# Patient Record
Sex: Female | Born: 1975 | Race: Black or African American | Hispanic: No | Marital: Married | Smoking: Never smoker
Health system: Southern US, Community
[De-identification: ages and names within clinical notes are randomized; demographics above are authoritative.]

---

## 2015-07-09 ENCOUNTER — Ambulatory Visit
Admission: RE | Admit: 2015-07-09 | Discharge: 2015-07-09 | Disposition: A | Discharge status: Home / Self Care | Payer: No Typology Code available for payment source | Source: Ambulatory Visit | Attending: Infectious Disease | Admitting: Infectious Disease

## 2015-07-09 ENCOUNTER — Other Ambulatory Visit: Payer: Self-pay | Admitting: Infectious Disease

## 2015-07-09 DIAGNOSIS — R7612 Nonspecific reaction to cell mediated immunity measurement of gamma interferon antigen response without active tuberculosis: Secondary | ICD-10-CM

## 2015-10-08 ENCOUNTER — Emergency Department (HOSPITAL_COMMUNITY): Payer: Medicaid Other

## 2015-10-08 ENCOUNTER — Emergency Department (EMERGENCY_DEPARTMENT_HOSPITAL)
Admit: 2015-10-08 | Discharge: 2015-10-08 | Disposition: A | Discharge status: Home / Self Care | Payer: Medicaid Other | Attending: Emergency Medicine | Admitting: Emergency Medicine

## 2015-10-08 ENCOUNTER — Emergency Department (HOSPITAL_COMMUNITY)
Admission: EM | Admit: 2015-10-08 | Discharge: 2015-10-08 | Disposition: A | Discharge status: Home / Self Care | Payer: Medicaid Other | Attending: Emergency Medicine | Admitting: Emergency Medicine

## 2015-10-08 ENCOUNTER — Encounter (HOSPITAL_COMMUNITY): Payer: Self-pay | Admitting: *Deleted

## 2015-10-08 DIAGNOSIS — M79604 Pain in right leg: Secondary | ICD-10-CM

## 2015-10-08 DIAGNOSIS — R911 Solitary pulmonary nodule: Secondary | ICD-10-CM | POA: Insufficient documentation

## 2015-10-08 DIAGNOSIS — B349 Viral infection, unspecified: Secondary | ICD-10-CM | POA: Diagnosis not present

## 2015-10-08 DIAGNOSIS — M79605 Pain in left leg: Secondary | ICD-10-CM | POA: Diagnosis not present

## 2015-10-08 DIAGNOSIS — Z3202 Encounter for pregnancy test, result negative: Secondary | ICD-10-CM | POA: Insufficient documentation

## 2015-10-08 DIAGNOSIS — R52 Pain, unspecified: Secondary | ICD-10-CM | POA: Diagnosis present

## 2015-10-08 LAB — CBC
HEMATOCRIT: 34.2 % — AB (ref 36.0–46.0)
Hemoglobin: 12 g/dL (ref 12.0–15.0)
MCH: 31.7 pg (ref 26.0–34.0)
MCHC: 35.1 g/dL (ref 30.0–36.0)
MCV: 90.5 fL (ref 78.0–100.0)
PLATELETS: 151 10*3/uL (ref 150–400)
RBC: 3.78 MIL/uL — ABNORMAL LOW (ref 3.87–5.11)
RDW: 12.8 % (ref 11.5–15.5)
WBC: 3.8 10*3/uL — ABNORMAL LOW (ref 4.0–10.5)

## 2015-10-08 LAB — URINALYSIS, ROUTINE W REFLEX MICROSCOPIC
BILIRUBIN URINE: NEGATIVE
Glucose, UA: NEGATIVE mg/dL
KETONES UR: NEGATIVE mg/dL
NITRITE: NEGATIVE
PROTEIN: NEGATIVE mg/dL
SPECIFIC GRAVITY, URINE: 1.008 (ref 1.005–1.030)
pH: 7 (ref 5.0–8.0)

## 2015-10-08 LAB — I-STAT TROPONIN, ED: TROPONIN I, POC: 0 ng/mL (ref 0.00–0.08)

## 2015-10-08 LAB — BASIC METABOLIC PANEL
ANION GAP: 10 (ref 5–15)
BUN: 5 mg/dL — ABNORMAL LOW (ref 6–20)
CHLORIDE: 105 mmol/L (ref 101–111)
CO2: 20 mmol/L — ABNORMAL LOW (ref 22–32)
Calcium: 8.3 mg/dL — ABNORMAL LOW (ref 8.9–10.3)
Creatinine, Ser: 0.52 mg/dL (ref 0.44–1.00)
GFR calc Af Amer: >60 mL/min (ref >60)
GLUCOSE: 117 mg/dL — AB (ref 65–99)
POTASSIUM: 3.3 mmol/L — AB (ref 3.5–5.1)
Sodium: 135 mmol/L (ref 135–145)

## 2015-10-08 LAB — D-DIMER, QUANTITATIVE (NOT AT ARMC): D DIMER QUANT: 0.63 ug{FEU}/mL — AB (ref 0.00–0.50)

## 2015-10-08 LAB — URINE MICROSCOPIC-ADD ON

## 2015-10-08 LAB — PREGNANCY, URINE: PREG TEST UR: NEGATIVE

## 2015-10-08 MED ORDER — IOHEXOL 350 MG/ML SOLN
100.0000 mL | Freq: Once | INTRAVENOUS | Status: AC | PRN
  Administered 2015-10-08: 100 mL via INTRAVENOUS

## 2015-10-08 NOTE — ED Notes (Signed)
Pt ambulated to BR without difficulty. U/A obtained.

## 2015-10-08 NOTE — Discharge Instructions (Signed)
You need to have a repeat CT scan of your chest in one year to reassess the pulmonary nodule found today.   Pulmonary Nodule A pulmonary nodule is a small, round growth of tissue in the lung. Pulmonary nodules can range in size from less than 1/5 inch (4 mm) to a little bigger than an inch (25 mm). Most pulmonary nodules are detected when imaging tests of the lung are being performed for a different problem. Pulmonary nodules are usually not cancerous (benign). However, some pulmonary nodules are cancerous (malignant). Follow-up treatment or testing is based on the size of the pulmonary nodule and your risk of getting lung cancer.  CAUSES Benign pulmonary nodules can be caused by various things. Some of the causes include:   Bacterial, fungal, or viral infections. This is usually an old infection that is no longer active, but it can sometimes be a current, active infection.  A benign mass of tissue.  Inflammation from conditions such as rheumatoid arthritis.   Abnormal blood vessels in the lungs. Malignant pulmonary nodules can result from lung cancer or from cancers that spread to the lung from other places in the body. SIGNS AND SYMPTOMS Pulmonary nodules usually do not cause symptoms. DIAGNOSIS Most often, pulmonary nodules are found incidentally when an X-ray or CT scan is performed to look for some other problem in the lung area. To help determine whether a pulmonary nodule is benign or malignant, your health care provider will take a medical history and order a variety of tests. Tests done may include:   Blood tests.  A skin test called a tuberculin test. This test is used to determine if you have been exposed to the germ that causes tuberculosis.   Chest X-rays. If possible, a new X-ray may be compared with X-rays you have had in the past.   CT scan. This test shows smaller pulmonary nodules more clearly than an X-ray.   Positron emission tomography (PET) scan. In this test,  a safe amount of a radioactive substance is injected into the bloodstream. Then, the scan takes a picture of the pulmonary nodule. The radioactive substance is eliminated from your body in your urine.   Biopsy. A tiny piece of the pulmonary nodule is removed so it can be checked under a microscope. TREATMENT  Pulmonary nodules that are benign normally do not require any treatment because they usually do not cause symptoms or breathing problems. Your health care provider may want to monitor the pulmonary nodule through follow-up CT scans. The frequency of these CT scans will vary based on the size of the nodule and the risk factors for lung cancer. For example, CT scans will need to be done more frequently if the pulmonary nodule is larger and if you have a history of smoking and a family history of cancer. Further testing or biopsies may be done if any follow-up CT scan shows that the size of the pulmonary nodule has increased. HOME CARE INSTRUCTIONS  Only take over-the-counter or prescription medicines as directed by your health care provider.  Keep all follow-up appointments with your health care provider. SEEK MEDICAL CARE IF:  You have trouble breathing when you are active.   You feel sick or unusually tired.   You do not feel like eating.   You lose weight without trying to.   You develop chills or night sweats.  SEEK IMMEDIATE MEDICAL CARE IF:  You cannot catch your breath, or you begin wheezing.   You cannot stop coughing.  You cough up blood.   You become dizzy or feel like you are going to pass out.   You have sudden chest pain.   You have a fever or persistent symptoms for more than 2-3 days.   You have a fever and your symptoms suddenly get worse. MAKE SURE YOU:  Understand these instructions.  Will watch your condition.  Will get help right away if you are not doing well or get worse.   This information is not intended to replace advice given to  you by your health care provider. Make sure you discuss any questions you have with your health care provider.   Document Released: 06/08/2009 Document Revised: 04/13/2013 Document Reviewed: 01/31/2013 Elsevier Interactive Patient Education 2016 Elsevier Inc. Viral Infections A virus is a type of germ. Viruses can cause:  Minor sore throats.  Aches and pains.  Headaches.  Runny nose.  Rashes.  Watery eyes.  Tiredness.  Coughs.  Loss of appetite.  Feeling sick to your stomach (nausea).  Throwing up (vomiting).  Watery poop (diarrhea). HOME CARE   Only take medicines as told by your doctor.  Drink enough water and fluids to keep your pee (urine) clear or pale yellow. Sports drinks are a good choice.  Get plenty of rest and eat healthy. Soups and broths with crackers or rice are fine. GET HELP RIGHT AWAY IF:   You have a very bad headache.  You have shortness of breath.  You have chest pain or neck pain.  You have an unusual rash.  You cannot stop throwing up.  You have watery poop that does not stop.  You cannot keep fluids down.  You or your child has a temperature by mouth above 102 F (38.9 C), not controlled by medicine.  Your baby is older than 3 months with a rectal temperature of 102 F (38.9 C) or higher.  Your baby is 27 months old or younger with a rectal temperature of 100.4 F (38 C) or higher. MAKE SURE YOU:   Understand these instructions.  Will watch this condition.  Will get help right away if you are not doing well or get worse.   This information is not intended to replace advice given to you by your health care provider. Make sure you discuss any questions you have with your health care provider.   Document Released: 07/24/2008 Document Revised: 11/03/2011 Document Reviewed: 01/17/2015 Elsevier Interactive Patient Education [employer]

## 2015-10-08 NOTE — Progress Notes (Signed)
VASCULAR LAB PRELIMINARY  PRELIMINARY  PRELIMINARY  PRELIMINARY  Bilateral lower extremity venous duplex completed.    Preliminary report:  There is no DVT or SVT noted in the bilateral lower extremities.   Ryett Hamman, RVT 10/08/2015, 11:54 AM

## 2015-10-08 NOTE — ED Provider Notes (Signed)
CSN: [id number]     Arrival date & time 10/08/15  1324 History   First MD Initiated Contact with Patient 10/08/15 [phone]     Chief Complaint  Patient presents with  . Generalized Body Aches     (Consider location/radiation/quality/duration/timing/severity/associated sxs/prior Treatment) HPI Comments: Patient presents to the emergency department with chief complaint of generalized body aches. She reports associated chest pain, cough, headache, and lower leg swelling. She reports that she has had a fever, but did not measure it. She denies any recent travel or surgeries. There are no aggravating or alleviating factors. She has tried taking ibuprofen with no relief.  The history is provided by the patient. The history is limited by a language barrier. A language interpreter was used.    History reviewed. No pertinent past medical history. History reviewed. No pertinent past surgical history. No family history on file. Social History  Substance Use Topics  . Smoking status: Never Smoker   . Smokeless tobacco: None  . Alcohol Use: None   OB History    No data available     Review of Systems  Constitutional: Positive for fever. Negative for chills.  Respiratory: Positive for cough. Negative for shortness of breath.   Cardiovascular: Negative for chest pain.  Gastrointestinal: Negative for nausea, vomiting, diarrhea and constipation.  Genitourinary: Negative for dysuria.  Musculoskeletal: Positive for myalgias.  All other systems reviewed and are negative.     Allergies  Review of patient's allergies indicates no known allergies.  Home Medications   Prior to Admission medications   Not on File   BP 92/74 mmHg  Pulse 85  Temp(Src) 99 F (37.2 C) (Oral)  Resp 16  SpO2 97%  LMP 10/03/2015 (Exact Date) Physical Exam  Constitutional: She is oriented to person, place, and time. She appears well-developed and well-nourished.  HENT:  Head: Normocephalic and atraumatic.   Eyes: Conjunctivae and EOM are normal. Pupils are equal, round, and reactive to light.  Neck: Normal range of motion. Neck supple.  Cardiovascular: Normal rate and regular rhythm.  Exam reveals no gallop and no friction rub.   No murmur heard. Pulmonary/Chest: Effort normal and breath sounds normal. No respiratory distress. She has no wheezes. She has no rales. She exhibits no tenderness.  CTAB  Abdominal: Soft. Bowel sounds are normal. She exhibits no distension and no mass. There is no tenderness. There is no rebound and no guarding.  No focal abdominal tenderness, no RLQ tenderness or pain at McBurney's point, no RUQ tenderness or Murphy's sign, no left-sided abdominal tenderness, no fluid wave, or signs of peritonitis   Musculoskeletal: Normal range of motion. She exhibits no edema or tenderness.  No appreciable leg swelling or calf tenderness on exam  Neurological: She is alert and oriented to person, place, and time.  Skin: Skin is warm and dry.  Psychiatric: She has a normal mood and affect. Her behavior is normal. Judgment and thought content normal.  Nursing note and vitals reviewed.   ED Course  Procedures (including critical care time) Results for orders placed or performed during the hospital encounter of 10/08/15  CBC  Result Value Ref Range   WBC 3.8 (L) 4.0 - 10.5 K/uL   RBC 3.78 (L) 3.87 - 5.11 MIL/uL   Hemoglobin 12.0 12.0 - 15.0 g/dL   HCT 27.2 (L) 53.6 - 64.4 %   MCV 90.5 78.0 - 100.0 fL   MCH 31.7 26.0 - 34.0 pg   MCHC 35.1 30.0 - 36.0 g/dL  RDW 12.8 11.5 - 15.5 %   Platelets 151 150 - 400 K/uL  Basic metabolic panel  Result Value Ref Range   Sodium 135 135 - 145 mmol/L   Potassium 3.3 (L) 3.5 - 5.1 mmol/L   Chloride 105 101 - 111 mmol/L   CO2 20 (L) 22 - 32 mmol/L   Glucose, Bld 117 (H) 65 - 99 mg/dL   BUN 5 (L) 6 - 20 mg/dL   Creatinine, Ser 1.61 0.44 - 1.00 mg/dL   Calcium 8.3 (L) 8.9 - 10.3 mg/dL   GFR calc non Af Amer >60 >60 mL/min   GFR calc Af  Amer >60 >60 mL/min   Anion gap 10 5 - 15  D-dimer, quantitative (not at [facility])  Result Value Ref Range   D-Dimer, Quant 0.63 (H) 0.00 - 0.50 ug/mL-FEU  Urinalysis, Routine w reflex microscopic (not at [facility])  Result Value Ref Range   Color, Urine AMBER (A) YELLOW   APPearance CLEAR CLEAR   Specific Gravity, Urine 1.008 1.005 - 1.030   pH 7.0 5.0 - 8.0   Glucose, UA NEGATIVE NEGATIVE mg/dL   Hgb urine dipstick MODERATE (A) NEGATIVE   Bilirubin Urine NEGATIVE NEGATIVE   Ketones, ur NEGATIVE NEGATIVE mg/dL   Protein, ur NEGATIVE NEGATIVE mg/dL   Nitrite NEGATIVE NEGATIVE   Leukocytes, UA TRACE (A) NEGATIVE  Pregnancy, urine  Result Value Ref Range   Preg Test, Ur NEGATIVE NEGATIVE  Urine microscopic-add on  Result Value Ref Range   Squamous Epithelial / LPF 6-30 (A) NONE SEEN   WBC, UA 0-5 0 - 5 WBC/hpf   RBC / HPF 0-5 0 - 5 RBC/hpf   Bacteria, UA FEW (A) NONE SEEN  I-stat troponin, ED  Result Value Ref Range   Troponin i, poc 0.00 0.00 - 0.08 ng/mL   Comment 3           Dg Chest 2 View  10/08/2015  CLINICAL DATA:  Generalized body aches, leg pain and cough today. Initial encounter. EXAM: CHEST  2 VIEW COMPARISON:  Seen abscess 07/09/2015. FINDINGS: The lungs are clear. Heart size is upper normal. No pneumothorax or pleural effusion. No focal bony abnormality. IMPRESSION: No acute disease. Electronically Signed   By: Drusilla Kanner M.D.   On: 10/08/2015 10:44   Ct Angio Chest Pe W/cm &/or Wo Cm  10/08/2015  CLINICAL DATA:  Chest pain. Bilateral lower extremity pain and swelling. EXAM: CT ANGIOGRAPHY CHEST WITH CONTRAST TECHNIQUE: Multidetector CT imaging of the chest was performed using the standard protocol during bolus administration of intravenous contrast. Multiplanar CT image reconstructions and MIPs were obtained to evaluate the vascular anatomy. CONTRAST:  OMNIPAQUE IOHEXOL 350 MG/ML SOLN COMPARISON:  10/08/2015 chest radiograph.  No prior chest CT. FINDINGS:  Mediastinum/Nodes: The study is moderate to high quality for the evaluation of pulmonary embolism, mildly limited by motion artifact in the subsegmental branches particularly in the lower lungs. There are no filling defects in the central, lobar, segmental or subsegmental pulmonary artery branches to suggest acute pulmonary embolism. Great vessels are normal in course and caliber. Normal heart size. No pericardial fluid/thickening. Normal visualized thyroid. Normal esophagus. No pathologically enlarged axillary, mediastinal or hilar lymph nodes. Lungs/Pleura: No pneumothorax. No pleural effusion. Subpleural 4 mm left lower lobe pulmonary nodule (series 5/ image 77). Irregular curvilinear parenchymal bands in both lower lobes are most suggestive of atelectasis. No acute consolidative airspace disease, additional significant pulmonary nodules or lung masses. There is diffuse bronchial wall thickening.  There is a mosaic attenuation throughout both lungs. Upper abdomen: Unremarkable. Musculoskeletal:  No aggressive appearing focal osseous lesions. Review of the MIP images confirms the above findings. IMPRESSION: 1. No evidence of acute pulmonary embolism. 2. Diffuse bronchial wall thickening and mosaic attenuation throughout both lungs, suggesting small airways disease with air trapping. Curvilinear parenchymal bands in both lower lobes are most suggestive of subsegmental atelectasis. 3. Left lower lobe 4 mm subpleural pulmonary nodule, probably benign. If the patient is at high risk for bronchogenic carcinoma, follow-up chest CT at 1 year is recommended. If the patient is at low risk, no follow-up is needed. This recommendation follows the consensus statement: Guidelines for Management of Small Pulmonary Nodules Detected on CT Scans: A Statement from the Fleischner Society as published in Radiology 2005; 237:395-400. Electronically Signed   By: Delbert Phenix M.D.   On: 10/08/2015 13:18    I have personally reviewed  and evaluated these images and lab results as part of my medical decision-making.   EKG Interpretation None      MDM   Final diagnoses:  Viral syndrome  Pulmonary nodule    Patient with vague symptoms of chest pain, leg pain, generally feeling poorly. Denies any fevers or chills. Denies nausea or vomiting. History difficult to obtain even using a professional interpreter. She has an elevated d-dimer, will check DVT studies and CT chest given her vague chest pain and leg pain complaints.  Labs are reassuring. Urinalysis is negative for infection. She does not have a leukocytosis. CT scan is negative for PE. There is a small pulmonary nodule, for which the patient will need to follow-up in one year. I discussed this with patient using the translator. Patient understands and agrees with the plan. No further workup indicated today. Will discharge to home.    Roxy Horseman, PA-C 10/08/15 1407  Blane Ohara, MD 10/08/15 [phone]

## 2015-10-08 NOTE — ED Notes (Signed)
Pt speaks Swahili interperter phone used. Pt reports generalized body aches and leg pain. Pt states that she has back pain as well but denies urinary symptoms. Pt states that she took ibuprofen this morning.

## 2015-10-08 NOTE — ED Notes (Signed)
Chest pain, headache, coughing, legs swollen -- per interpreter on language line.  Started yesterday, subjective fever,  Travel > 4 hours in 2 months-- denies Denies any vomiting or diarrhea States has pain and swelling to bilateral lower legs

## 2015-10-16 ENCOUNTER — Encounter: Payer: Self-pay | Admitting: Family Medicine

## 2015-10-16 ENCOUNTER — Ambulatory Visit: Payer: Medicaid Other | Attending: Family Medicine | Admitting: Family Medicine

## 2015-10-16 VITALS — BP 116/79 | HR 60 | Temp 98.5°F | Resp 15 | Ht 62.0 in | Wt 185.0 lb

## 2015-10-16 DIAGNOSIS — R911 Solitary pulmonary nodule: Secondary | ICD-10-CM | POA: Insufficient documentation

## 2015-10-16 DIAGNOSIS — E876 Hypokalemia: Secondary | ICD-10-CM | POA: Insufficient documentation

## 2015-10-16 DIAGNOSIS — Z131 Encounter for screening for diabetes mellitus: Secondary | ICD-10-CM | POA: Diagnosis not present

## 2015-10-16 LAB — POCT GLYCOSYLATED HEMOGLOBIN (HGB A1C): Hemoglobin A1C: 5

## 2015-10-16 NOTE — Progress Notes (Signed)
Patient's husband states patient has been sick with the flu for a week

## 2015-10-16 NOTE — Progress Notes (Signed)
CC: Follow-up from the ED  HPI: Whitney Phillips is a 40 y.o. female here today for a follow up visit from [facility] ED where she was seen for myalgias, feeling poorly, leg pain ; workup revealed normal CBC, hypokalemia of 3.3, an elevated d-dimer but lower extremity Doppler was negative for DVT and CT chest negative for pulmonary embolism but revealed a left lower lobe 4 mm pulmonary nodule with recommendations to follow-up in one year.   She is accompanied by her husband today and reports feeling well and is requesting a note to return back to work at the [employer] where she works as all her symptoms have resolved.  Patient has No headache, No chest pain, No abdominal pain - No Nausea, No new weakness tingling or numbness, No Cough - SOB.  No Known Allergies History reviewed. No pertinent past medical history. No current outpatient prescriptions on file prior to visit.   No current facility-administered medications on file prior to visit.   History reviewed. No pertinent family history. Social History   Social History  . Marital Status: Married    Spouse Name: N/A  . Number of Children: N/A  . Years of Education: N/A   Occupational History  . Not on file.   Social History Main Topics  . Smoking status: Never Smoker   . Smokeless tobacco: Not on file  . Alcohol Use: No  . Drug Use: No  . Sexual Activity: Not on file   Other Topics Concern  . Not on file   Social History Narrative    Review of Systems: Constitutional: Negative for fever, chills, diaphoresis, activity change, appetite change and fatigue. HENT: Negative for ear pain, nosebleeds, congestion, facial swelling, rhinorrhea, neck pain, neck stiffness and ear discharge.  Eyes: Negative for pain, discharge, redness, itching and visual disturbance. Respiratory: Negative for cough, choking, chest tightness, shortness of breath, wheezing and stridor.  Cardiovascular: Negative for chest pain, palpitations and  leg swelling. Gastrointestinal: Negative for abdominal distention. Genitourinary: Negative for dysuria, urgency, frequency, hematuria, flank pain, decreased urine volume, difficulty urinating and dyspareunia.  Musculoskeletal: Negative for back pain, joint swelling, arthralgias and gait problem. Neurological: Negative for dizziness, tremors, seizures, syncope, facial asymmetry, speech difficulty, weakness, light-headedness, numbness and headaches.  Hematological: Negative for adenopathy. Does not bruise/bleed easily. Psychiatric/Behavioral: Negative for hallucinations, behavioral problems, confusion, dysphoric mood, decreased concentration and agitation.    Objective:   Filed Vitals:   10/16/15 1152  BP: 116/79  Pulse: 60  Temp: 98.5 F (36.9 C)  Resp: 15    Physical Exam: Constitutional: Patient appears well-developed and well-nourished. No distress. HENT: Normocephalic, atraumatic, External right and left ear normal. Oropharynx is clear and moist.  Eyes: Conjunctivae and EOM are normal. PERRLA, no scleral icterus. Neck: Normal ROM. Neck supple. No JVD. No tracheal deviation. No thyromegaly. CVS: RRR, S1/S2 +, no murmurs, no gallops, no carotid bruit.  Pulmonary: Effort and breath sounds normal, no stridor, rhonchi, wheezes, rales.  Abdominal: Soft. BS +,  no distension, tenderness, rebound or guarding.  Musculoskeletal: Normal range of motion. No edema and no tenderness.  Lymphadenopathy: No lymphadenopathy noted, cervical, inguinal or axillary Neuro: Alert. Normal reflexes, muscle tone coordination. No cranial nerve deficit. Skin: Skin is warm and dry. No rash noted. Not diaphoretic. No erythema. No pallor. Psychiatric: Normal mood and affect. Behavior, judgment, thought content normal.  Lab Results  Component Value Date   WBC 3.8* 10/08/2015   HGB 12.0 10/08/2015   HCT 34.2* 10/08/2015  MCV 90.5 10/08/2015   PLT 151 10/08/2015   Lab Results  Component Value Date    CREATININE 0.52 10/08/2015   BUN 5* 10/08/2015   NA 135 10/08/2015   K 3.3* 10/08/2015   CL 105 10/08/2015   CO2 20* 10/08/2015    Lab Results  Component Value Date   HGBA1C 5.0 10/16/2015   Lipid Panel  No results found for: CHOL, TRIG, HDL, CHOLHDL, VLDL, LDLCALC     Assessment and plan:   Left lung pulmonary nodule: Follow-up CT scan in one year. I have written a note that the patient to return to work without restrictions.  Hypokalemia: Advised to increase intake of potassium rich foods. We'll hold off on replacement at this time  Jaclyn Shaggy, MD. [facility] [phone] 10/16/2015, 12:27 PM

## 2015-10-17 ENCOUNTER — Encounter: Payer: Self-pay | Admitting: *Deleted

## 2015-10-17 ENCOUNTER — Telehealth: Payer: Self-pay | Admitting: Family Medicine

## 2015-10-17 NOTE — Telephone Encounter (Signed)
Pt. Husband came in today to drop off a return to work certificate paperwork to be filled out by the pt. PCP.

## 2015-10-17 NOTE — Telephone Encounter (Signed)
Gave letter today allowing patient to return to work.

## 2015-10-17 NOTE — Telephone Encounter (Signed)
Pt.'s husband states that wife needs a letter from PCP specifying reason why patient is out of work....please follow up

## 2016-04-30 ENCOUNTER — Ambulatory Visit: Payer: Medicaid Other | Attending: Family Medicine | Admitting: Family Medicine

## 2016-04-30 ENCOUNTER — Encounter: Payer: Self-pay | Admitting: Family Medicine

## 2016-04-30 VITALS — BP 97/65 | HR 65 | Temp 98.0°F | Resp 17 | Wt 204.2 lb

## 2016-04-30 DIAGNOSIS — Z029 Encounter for administrative examinations, unspecified: Secondary | ICD-10-CM | POA: Insufficient documentation

## 2016-04-30 DIAGNOSIS — R0789 Other chest pain: Secondary | ICD-10-CM

## 2016-04-30 DIAGNOSIS — M79604 Pain in right leg: Secondary | ICD-10-CM

## 2016-04-30 DIAGNOSIS — R911 Solitary pulmonary nodule: Secondary | ICD-10-CM

## 2016-04-30 DIAGNOSIS — M79601 Pain in right arm: Secondary | ICD-10-CM

## 2016-04-30 MED ORDER — CYCLOBENZAPRINE HCL 10 MG PO TABS: 10.0000 mg | ORAL_TABLET | Freq: Every day | ORAL | 1 refills | Status: AC

## 2016-04-30 MED ORDER — NAPROXEN 500 MG PO TABS: 500.0000 mg | ORAL_TABLET | Freq: Two times a day (BID) | ORAL | 1 refills | Status: AC

## 2016-04-30 NOTE — Progress Notes (Signed)
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 Pain on my chest too.  Happening for a long time. The health center found out she had lump and swelling on chest and gave her medication.

## 2016-04-30 NOTE — Progress Notes (Signed)
Subjective:  Patient ID: Whitney Phillips, female    DOB: April 27, 1976  Age: 40 y.o. 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 She describes the pain as being "inside" and takes no analgesics for this. She also tells me she has a "problem with her lungs" which she would like to follow-up on. CT Angio chest in 09/2015 revealed an incidental finding of a solitary left lower lobe 4 mm subpleural nodule probably benign. She denies shortness of breath or wheezing and does not have a history of smoking.  No outpatient prescriptions prior to visit.   No facility-administered medications prior to visit.     ROS Review of Systems  Constitutional: Negative for activity change, appetite change and fatigue.  HENT: Negative for congestion, sinus pressure and sore throat.   Eyes: Negative for visual disturbance.  Respiratory: Negative for cough, chest tightness, shortness of breath and wheezing.   Cardiovascular: Positive for chest pain. Negative for palpitations.  Gastrointestinal: Negative for abdominal distention, abdominal pain and constipation.  Endocrine: Negative for polydipsia.  Genitourinary: Negative for dysuria and frequency.  Musculoskeletal:       See history of present illness  Skin: Negative for rash.  Neurological: Negative for tremors, light-headedness and numbness.  Hematological: Does not bruise/bleed easily.  Psychiatric/Behavioral: Negative for agitation and behavioral problems.    Objective:  BP 97/65 (BP Location: Right Arm)   Pulse 65   Temp 98 F (36.7 C)   Resp 17   Wt 204 lb 3.2 oz (92.6 kg)   SpO2 99%   BMI 37.35 kg/m   BP/Weight 04/30/2016 10/16/2015 10/08/2015  Systolic BP 97 116 113  Diastolic BP 65 79 92  Wt. (Lbs) 204.2 185 -  BMI 37.35 33.83 -      Physical Exam  Constitutional: She is oriented to person, place, and time. She appears well-developed  and well-nourished.  Cardiovascular: Normal rate, normal heart sounds and intact distal pulses.   No murmur heard. Pulmonary/Chest: Effort normal and breath sounds normal. She has no wheezes. She has no rales. She exhibits no tenderness.  Abdominal: Soft. Bowel sounds are normal. She exhibits no distension and no mass. There is no tenderness.  Musculoskeletal: Normal range of motion.  Neurological: She is alert and oriented to person, place, and time.     Assessment & Plan:   1. Solitary pulmonary nodule Follow-up CT chest due to in 09/2017  2. Musculoskeletal arm pain, right - naproxen (NAPROSYN) 500 MG tablet; Take 1 tablet (500 mg total) by mouth 2 (two) times daily with a meal.  Dispense: 60 tablet; Refill: 1 - cyclobenzaprine (FLEXERIL) 10 MG tablet; Take 1 tablet (10 mg total) by mouth at bedtime.  Dispense: 30 tablet; Refill: 1  3. Musculoskeletal chest pain EKG reveals normal sinus rhythm.   Meds ordered this encounter  Medications  . naproxen (NAPROSYN) 500 MG tablet    Sig: Take 1 tablet (500 mg total) by mouth 2 (two) times daily with a meal.    Dispense:  60 tablet    Refill:  1  . cyclobenzaprine (FLEXERIL) 10 MG tablet    Sig: Take 1 tablet (10 mg total) by mouth at bedtime.    Dispense:  30 tablet    Refill:  1    Follow-up: Return in about 5 months (around 09/30/2016), or if symptoms worsen or fail to improve, for follow up on  pulmonary nodule.   Jaclyn ShaggyEnobong Amao MD

## 2016-11-18 IMAGING — DX DG CHEST 2V
2 series · 2 of 2 positions shown · non-contrast
Comparison: Seen abscess 07/09/2015.

CLINICAL DATA: Generalized body aches, leg pain and cough today.
Initial encounter.

EXAM:
CHEST  2 VIEW

[w chest pa]
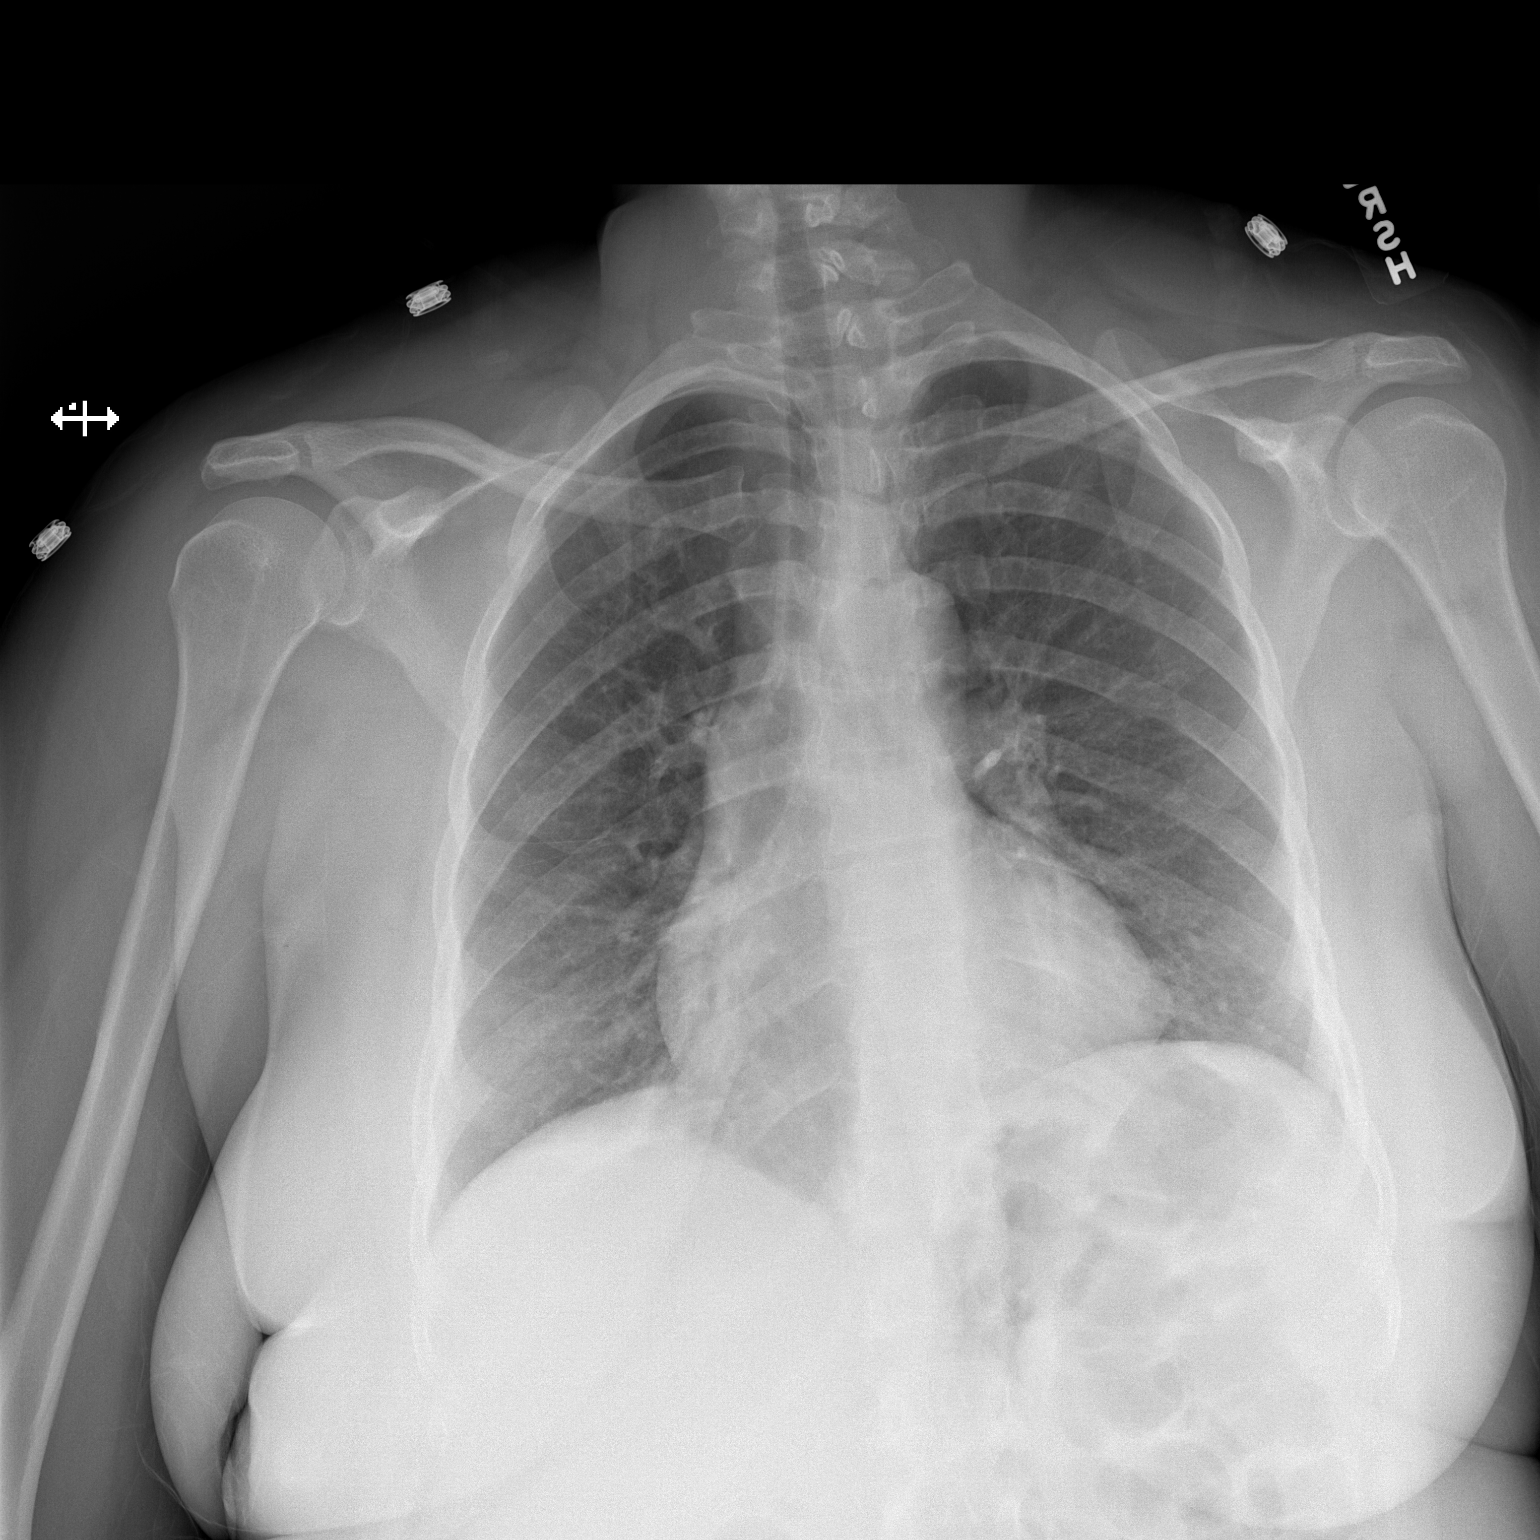

[w chest lat]
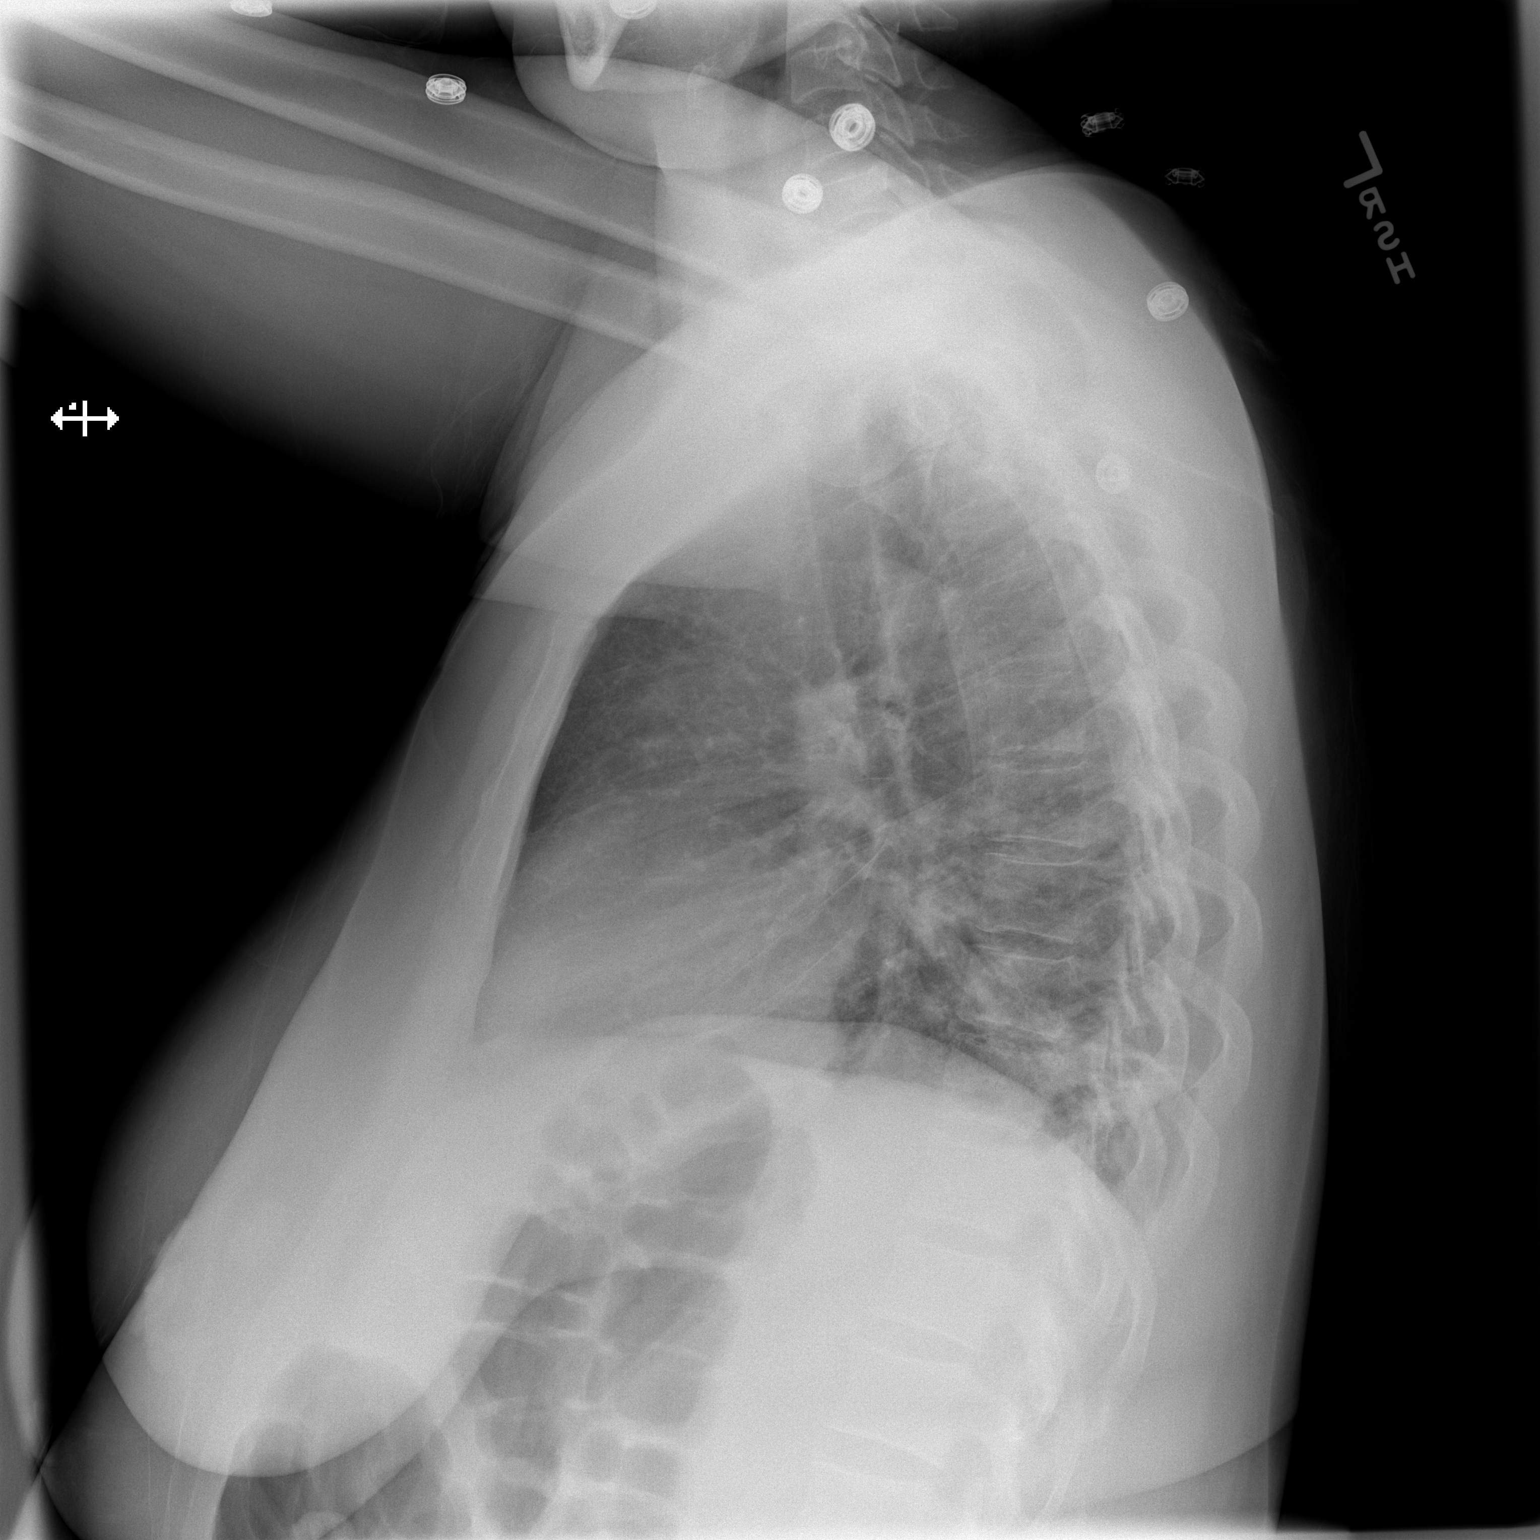

[2 of 2 positions shown; findings below may reference images not displayed]

FINDINGS: The lungs are clear. Heart size is upper normal. No pneumothorax or
pleural effusion. No focal bony abnormality.
IMPRESSION: No acute disease.

## 2016-11-18 IMAGING — CT CT ANGIO CHEST
3 of 6 series · 13 of 36 positions shown · IV contrast (Omni 300)
Comparison: 10/08/2015 chest radiograph.  No prior chest CT.

CLINICAL DATA: Chest pain. Bilateral lower extremity pain and
swelling.

EXAM:
CT ANGIOGRAPHY CHEST WITH CONTRAST
TECHNIQUE: Multidetector CT imaging of the chest was performed using the
standard protocol during bolus administration of intravenous
contrast. Multiplanar CT image reconstructions and MIPs were
obtained to evaluate the vascular anatomy.
CONTRAST:  100mL OMNIPAQUE IOHEXOL 350 MG/ML SOLN

[Series 4: pe 2mm · axial · 0.59mm/px · z∈[-258,-78]mm · 4 of 151 slices shown]
[im 31/151  lung]
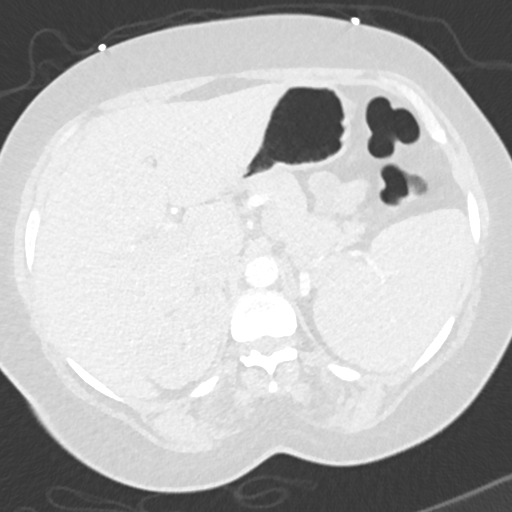
[im 61/151  mediastinal]
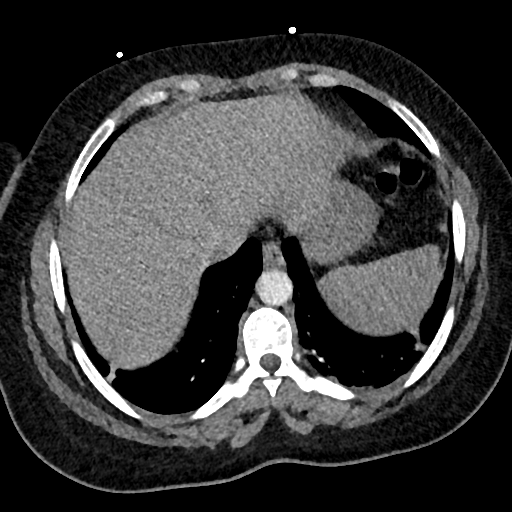
[im 91/151  lung]
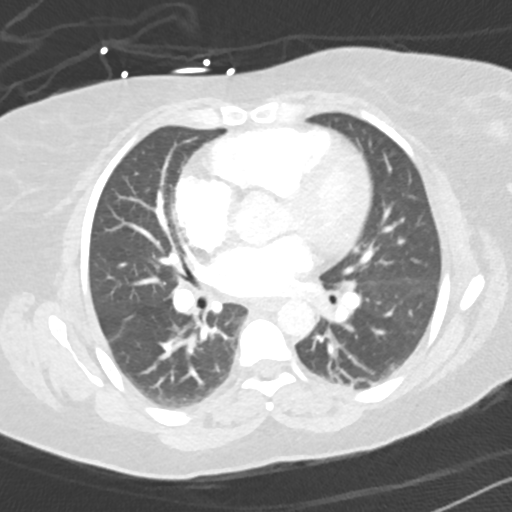
[im 121/151  mediastinal]
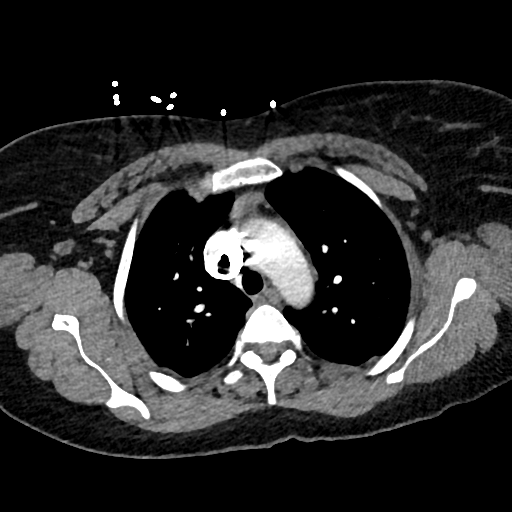

[Series 6: pe thins · axial · 0.59mm/px · z∈[-244,-42]mm · 8 of 502 slices shown]
[im 48/502  lung]
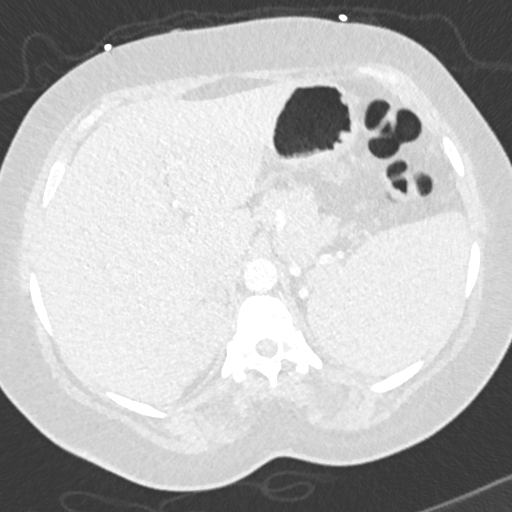
[im 96/502  lung]
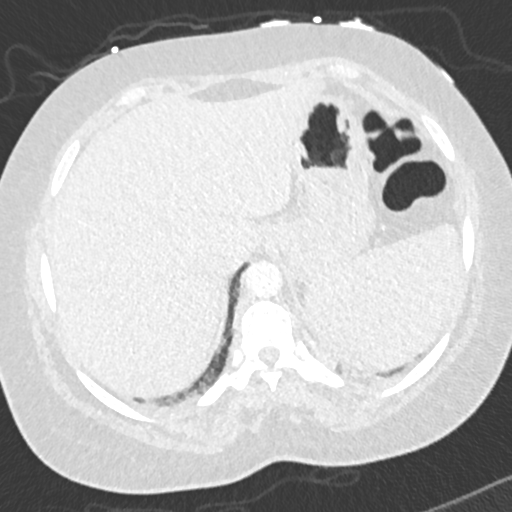
[im 168/502  lung]
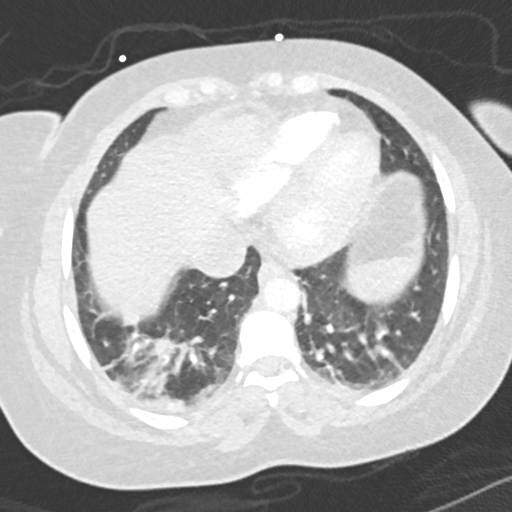
[im 215/502  lung]
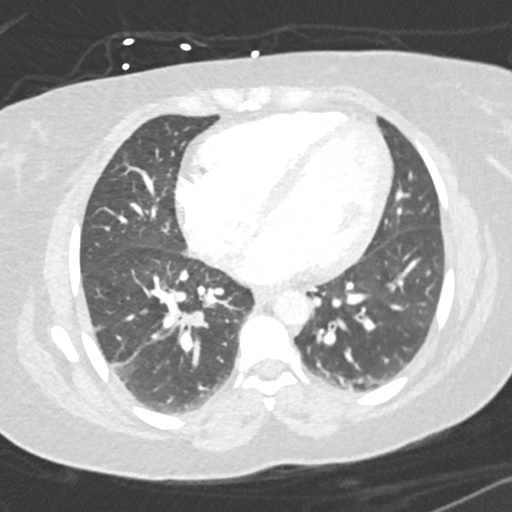
[im 287/502  lung]
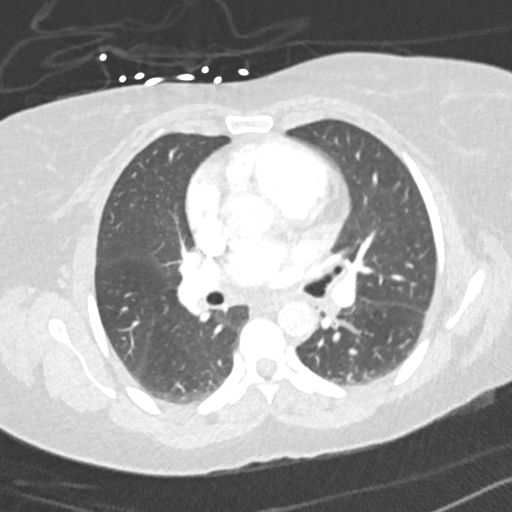
[im 335/502  lung]
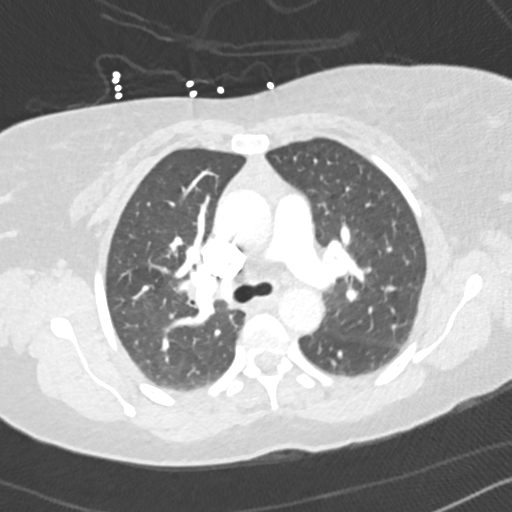
[im 406/502  lung]
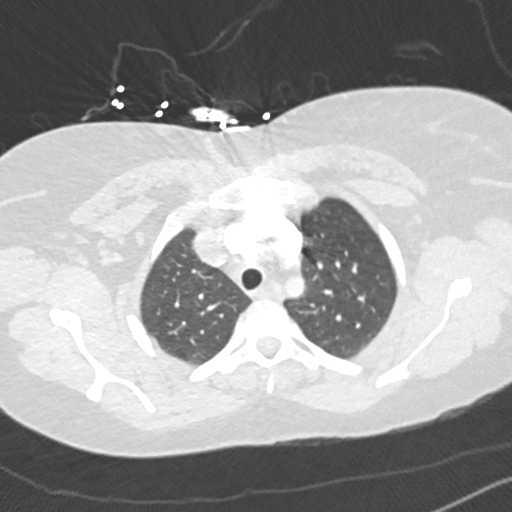
[im 454/502  lung]
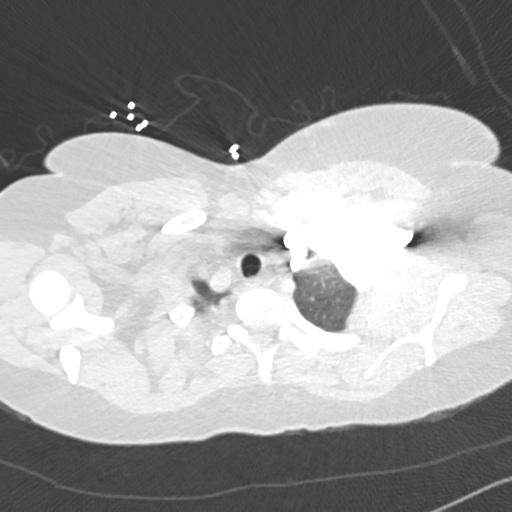

[Series 7: pe 2mm cor · coronal · 0.59mm/px · 1 of 123 slices shown]
[im 62/123  mediastinal]
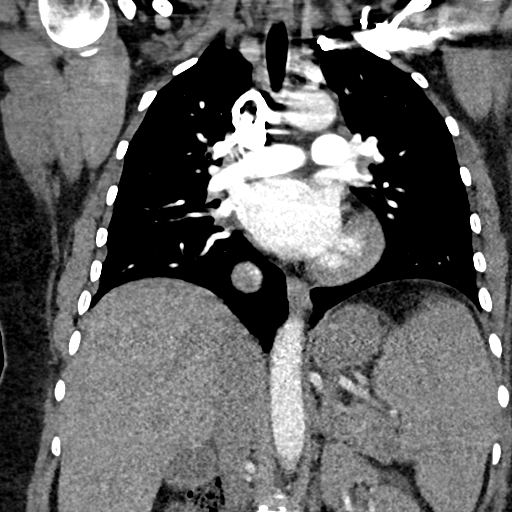

[13 of 36 positions shown; findings below may reference images not displayed]

FINDINGS: Mediastinum/Nodes: The study is moderate to high quality for the
evaluation of pulmonary embolism, mildly limited by motion artifact
in the subsegmental branches particularly in the lower lungs. There
are no filling defects in the central, lobar, segmental or
subsegmental pulmonary artery branches to suggest acute pulmonary
embolism. Great vessels are normal in course and caliber. Normal
heart size. No pericardial fluid/thickening. Normal visualized
thyroid. Normal esophagus. No pathologically enlarged axillary,
mediastinal or hilar lymph nodes.

Lungs/Pleura: No pneumothorax. No pleural effusion. Subpleural 4 mm
left lower lobe pulmonary nodule (series 5/ image 77). Irregular
curvilinear parenchymal bands in both lower lobes are most
suggestive of atelectasis. No acute consolidative airspace disease,
additional significant pulmonary nodules or lung masses. There is
diffuse bronchial wall thickening. There is a mosaic attenuation
throughout both lungs.

Upper abdomen: Unremarkable.

Musculoskeletal:  No aggressive appearing focal osseous lesions.

Review of the MIP images confirms the above findings.
IMPRESSION: 1. No evidence of acute pulmonary embolism.
2. Diffuse bronchial wall thickening and mosaic attenuation
throughout both lungs, suggesting small airways disease with air
trapping. Curvilinear parenchymal bands in both lower lobes are most
suggestive of subsegmental atelectasis.
3. Left lower lobe 4 mm subpleural pulmonary nodule, probably
benign. If the patient is at high risk for bronchogenic carcinoma,
follow-up chest CT at 1 year is recommended. If the patient is at
low risk, no follow-up is needed. This recommendation follows the
consensus statement: Guidelines for Management of Small Pulmonary
Nodules Detected on CT Scans: A Statement from the [employer]
